# Patient Record
Sex: Female | Born: 1965 | Race: Black or African American | Hispanic: No | Smoking: Never smoker
Health system: Southern US, Community
[De-identification: ages and names within clinical notes are randomized; demographics above are authoritative.]

## PROBLEM LIST (undated history)

## (undated) DIAGNOSIS — E119 Type 2 diabetes mellitus without complications: Secondary | ICD-10-CM

## (undated) DIAGNOSIS — I82409 Acute embolism and thrombosis of unspecified deep veins of unspecified lower extremity: Secondary | ICD-10-CM

## (undated) DIAGNOSIS — I10 Essential (primary) hypertension: Secondary | ICD-10-CM

## (undated) DIAGNOSIS — F32A Depression, unspecified: Secondary | ICD-10-CM

## (undated) DIAGNOSIS — F419 Anxiety disorder, unspecified: Secondary | ICD-10-CM

## (undated) HISTORY — PX: TUBAL LIGATION: SHX77

## (undated) HISTORY — PX: WRIST SURGERY: SHX841

---

## 2014-03-06 ENCOUNTER — Emergency Department (HOSPITAL_BASED_OUTPATIENT_CLINIC_OR_DEPARTMENT_OTHER)
Admission: EM | Admit: 2014-03-06 | Discharge: 2014-03-06 | Disposition: A | Discharge status: Home / Self Care | Payer: 59 | Attending: Emergency Medicine | Admitting: Emergency Medicine

## 2014-03-06 ENCOUNTER — Encounter (HOSPITAL_BASED_OUTPATIENT_CLINIC_OR_DEPARTMENT_OTHER): Payer: Self-pay | Admitting: *Deleted

## 2014-03-06 ENCOUNTER — Emergency Department (HOSPITAL_BASED_OUTPATIENT_CLINIC_OR_DEPARTMENT_OTHER): Payer: 59

## 2014-03-06 DIAGNOSIS — I10 Essential (primary) hypertension: Secondary | ICD-10-CM | POA: Insufficient documentation

## 2014-03-06 DIAGNOSIS — S29012A Strain of muscle and tendon of back wall of thorax, initial encounter: Secondary | ICD-10-CM | POA: Insufficient documentation

## 2014-03-06 DIAGNOSIS — R0602 Shortness of breath: Secondary | ICD-10-CM | POA: Insufficient documentation

## 2014-03-06 DIAGNOSIS — R079 Chest pain, unspecified: Secondary | ICD-10-CM

## 2014-03-06 DIAGNOSIS — S3992XA Unspecified injury of lower back, initial encounter: Secondary | ICD-10-CM | POA: Diagnosis present

## 2014-03-06 DIAGNOSIS — S29001A Unspecified injury of muscle and tendon of front wall of thorax, initial encounter: Secondary | ICD-10-CM | POA: Insufficient documentation

## 2014-03-06 DIAGNOSIS — Y9389 Activity, other specified: Secondary | ICD-10-CM | POA: Insufficient documentation

## 2014-03-06 DIAGNOSIS — Y9289 Other specified places as the place of occurrence of the external cause: Secondary | ICD-10-CM | POA: Insufficient documentation

## 2014-03-06 DIAGNOSIS — T148XXA Other injury of unspecified body region, initial encounter: Secondary | ICD-10-CM

## 2014-03-06 DIAGNOSIS — X58XXXA Exposure to other specified factors, initial encounter: Secondary | ICD-10-CM | POA: Diagnosis not present

## 2014-03-06 DIAGNOSIS — Z720 Tobacco use: Secondary | ICD-10-CM | POA: Diagnosis not present

## 2014-03-06 DIAGNOSIS — Y998 Other external cause status: Secondary | ICD-10-CM | POA: Diagnosis not present

## 2014-03-06 DIAGNOSIS — R0789 Other chest pain: Secondary | ICD-10-CM

## 2014-03-06 HISTORY — DX: Acute embolism and thrombosis of unspecified deep veins of unspecified lower extremity: I82.409

## 2014-03-06 HISTORY — DX: Essential (primary) hypertension: I10

## 2014-03-06 LAB — CBC WITH DIFFERENTIAL/PLATELET
BASOS ABS: 0 10*3/uL (ref 0.0–0.1)
BASOS PCT: 0 % (ref 0–1)
EOS ABS: 0 10*3/uL (ref 0.0–0.7)
EOS PCT: 0 % (ref 0–5)
HCT: 39.3 % (ref 36.0–46.0)
Hemoglobin: 13.2 g/dL (ref 12.0–15.0)
Lymphocytes Relative: 13 % (ref 12–46)
Lymphs Abs: 1 10*3/uL (ref 0.7–4.0)
MCH: 30.1 pg (ref 26.0–34.0)
MCHC: 33.6 g/dL (ref 30.0–36.0)
MCV: 89.7 fL (ref 78.0–100.0)
Monocytes Absolute: 0.6 10*3/uL (ref 0.1–1.0)
Monocytes Relative: 9 % (ref 3–12)
Neutro Abs: 5.9 10*3/uL (ref 1.7–7.7)
Neutrophils Relative %: 78 % — ABNORMAL HIGH (ref 43–77)
PLATELETS: 225 10*3/uL (ref 150–400)
RBC: 4.38 MIL/uL (ref 3.87–5.11)
RDW: 13.1 % (ref 11.5–15.5)
WBC: 7.5 10*3/uL (ref 4.0–10.5)

## 2014-03-06 LAB — BASIC METABOLIC PANEL
ANION GAP: 11 (ref 5–15)
BUN: 15 mg/dL (ref 6–23)
CALCIUM: 9.3 mg/dL (ref 8.4–10.5)
CO2: 28 mEq/L (ref 19–32)
Chloride: 101 mEq/L (ref 96–112)
Creatinine, Ser: 0.7 mg/dL (ref 0.50–1.10)
Glucose, Bld: 91 mg/dL (ref 70–99)
POTASSIUM: 4 meq/L (ref 3.7–5.3)
SODIUM: 140 meq/L (ref 137–147)

## 2014-03-06 LAB — TROPONIN I

## 2014-03-06 LAB — D-DIMER, QUANTITATIVE (NOT AT ARMC)

## 2014-03-06 MED ORDER — KETOROLAC TROMETHAMINE 30 MG/ML IJ SOLN
30.0000 mg | Freq: Once | INTRAMUSCULAR | Status: AC
  Administered 2014-03-06: 30 mg via INTRAVENOUS
  Filled 2014-03-06: qty 1

## 2014-03-06 MED ORDER — TRAMADOL HCL 50 MG PO TABS: 50.0000 mg | ORAL_TABLET | Freq: Four times a day (QID) | ORAL | Status: DC | PRN

## 2014-03-06 MED ORDER — CYCLOBENZAPRINE HCL 10 MG PO TABS: 10.0000 mg | ORAL_TABLET | Freq: Three times a day (TID) | ORAL | Status: AC | PRN

## 2014-03-06 NOTE — ED Notes (Signed)
Back pain since yesterday with SOB- hx of blood clot- [place] recent travel- [place] evaluating in triage

## 2014-03-06 NOTE — Discharge Instructions (Signed)
Chest Wall Pain Chest wall pain is pain in or around the bones and muscles of your chest. It may take up to 6 weeks to get better. It may take longer if you must stay physically active in your work and activities.  CAUSES  Chest wall pain may happen on its own. However, it may be caused by:  A viral illness like the flu.  Injury.  Coughing.  Exercise.  Arthritis.  Fibromyalgia.  Shingles. HOME CARE INSTRUCTIONS   Avoid overtiring physical activity. Try not to strain or perform activities that cause pain. This includes any activities using your chest or your abdominal and side muscles, especially if heavy weights are used.  Put ice on the sore area.  Put ice in a plastic bag.  Place a towel between your skin and the bag.  Leave the ice on for 15-20 minutes per hour while awake for the first 2 days.  Only take over-the-counter or prescription medicines for pain, discomfort, or fever as directed by your caregiver. SEEK IMMEDIATE MEDICAL CARE IF:   Your pain increases, or you are very uncomfortable.  You have a fever.  Your chest pain becomes worse.  You have new, unexplained symptoms.  You have nausea or vomiting.  You feel sweaty or lightheaded.  You have a cough with phlegm (sputum), or you cough up blood. MAKE SURE YOU:   Understand these instructions.  Will watch your condition.  Will get help right away if you are not doing well or get worse. Document Released: 04/15/2005 Document Revised: 07/08/2011 Document Reviewed: 12/10/2010 [facility] Patient Information 2015 [place], [place]. This information is not intended to replace advice given to you by your health care provider. Make sure you discuss any questions you have with your health care provider.  Muscle Strain A muscle strain is an injury that occurs when a muscle is stretched beyond its normal length. Usually a small number of muscle fibers are torn when this happens. Muscle strain is rated in degrees.  First-degree strains have the least amount of muscle fiber tearing and pain. Second-degree and third-degree strains have increasingly more tearing and pain.  Usually, recovery from muscle strain takes 1-2 weeks. Complete healing takes 5-6 weeks.  CAUSES  Muscle strain happens when a sudden, violent force placed on a muscle stretches it too far. This may occur with lifting, sports, or a fall.  RISK FACTORS Muscle strain is especially common in athletes.  SIGNS AND SYMPTOMS At the site of the muscle strain, there may be:  Pain.  Bruising.  Swelling.  Difficulty using the muscle due to pain or lack of normal function. DIAGNOSIS  Your health care provider will perform a physical exam and ask about your medical history. TREATMENT  Often, the best treatment for a muscle strain is resting, icing, and applying cold compresses to the injured area.  HOME CARE INSTRUCTIONS   Use the PRICE method of treatment to promote muscle healing during the first 2-3 days after your injury. The PRICE method involves:  Protecting the muscle from being injured again.  Restricting your activity and resting the injured body part.  Icing your injury. To do this, put ice in a plastic bag. Place a towel between your skin and the bag. Then, apply the ice and leave it on from 15-20 minutes each hour. After the third day, switch to moist heat packs.  Apply compression to the injured area with a splint or elastic bandage. Be careful not to wrap it too tightly. This  may interfere with blood circulation or increase swelling.  Elevate the injured body part above the level of your heart as often as you can.  Only take over-the-counter or prescription medicines for pain, discomfort, or fever as directed by your health care provider.  Warming up prior to exercise helps to prevent future muscle strains. SEEK MEDICAL CARE IF:   You have increasing pain or swelling in the injured area.  You have numbness, tingling, or  a significant loss of strength in the injured area. MAKE SURE YOU:   Understand these instructions.  Will watch your condition.  Will get help right away if you are not doing well or get worse. Document Released: 04/15/2005 Document Revised: 02/03/2013 Document Reviewed: 11/12/2012 [facility] Patient Information 2015 [place], [place]. This information is not intended to replace advice given to you by your health care provider. Make sure you discuss any questions you have with your health care provider. Muscle Strain A muscle strain is an injury that occurs when a muscle is stretched beyond its normal length. Usually a small number of muscle fibers are torn when this happens. Muscle strain is rated in degrees. First-degree strains have the least amount of muscle fiber tearing and pain. Second-degree and third-degree strains have increasingly more tearing and pain.  Usually, recovery from muscle strain takes 1-2 weeks. Complete healing takes 5-6 weeks.  CAUSES  Muscle strain happens when a sudden, violent force placed on a muscle stretches it too far. This may occur with lifting, sports, or a fall.  RISK FACTORS Muscle strain is especially common in athletes.  SIGNS AND SYMPTOMS At the site of the muscle strain, there may be:  Pain.  Bruising.  Swelling.  Difficulty using the muscle due to pain or lack of normal function. DIAGNOSIS  Your health care provider will perform a physical exam and ask about your medical history. TREATMENT  Often, the best treatment for a muscle strain is resting, icing, and applying cold compresses to the injured area.  HOME CARE INSTRUCTIONS   Use the PRICE method of treatment to promote muscle healing during the first 2-3 days after your injury. The PRICE method involves:  Protecting the muscle from being injured again.  Restricting your activity and resting the injured body part.  Icing your injury. To do this, put ice in a plastic bag. Place a towel  between your skin and the bag. Then, apply the ice and leave it on from 15-20 minutes each hour. After the third day, switch to moist heat packs.  Apply compression to the injured area with a splint or elastic bandage. Be careful not to wrap it too tightly. This may interfere with blood circulation or increase swelling.  Elevate the injured body part above the level of your heart as often as you can.  Only take over-the-counter or prescription medicines for pain, discomfort, or fever as directed by your health care provider.  Warming up prior to exercise helps to prevent future muscle strains. SEEK MEDICAL CARE IF:   You have increasing pain or swelling in the injured area.  You have numbness, tingling, or a significant loss of strength in the injured area. MAKE SURE YOU:   Understand these instructions.  Will watch your condition.  Will get help right away if you are not doing well or get worse. Document Released: 04/15/2005 Document Revised: 02/03/2013 Document Reviewed: 11/12/2012 [facility] Patient Information 2015 [place], [place]. This information is not intended to replace advice given to you by your health care  provider. Make sure you discuss any questions you have with your health care provider. ° °

## 2014-03-06 NOTE — ED Provider Notes (Signed)
CSN: [id number]     Arrival date & time 03/06/14  1517 History  This chart was scribed for Gilda Creasehristopher J. Jamirah Zelaya, * by Roxy Cedarhandni Bhalodia, ED Scribe. This patient was seen in room MH06/MH06 and the patient's care was started at 3:47 PM.   Chief Complaint  Patient presents with  . Back Pain   Patient is a 48 y.o. female presenting with back pain. The history is provided by the patient. No language interpreter was used.  Back Pain   HPI Comments: Katrina Callererri Penner is a 48 y.o. female with a history of DVT and hypertension, who presents to the Emergency Department complaining of moderate back pain that began yesterday. Patient states that the pain worsens with movement. She states that she has a history of a blood clot in her back and groin areas in the past. She also reports associated SOB and cough. She denies recent travel.  Past Medical History  Diagnosis Date  . DVT (deep venous thrombosis)   . Hypertension    History reviewed. No pertinent past surgical history. No family history on file. History  Substance Use Topics  . Smoking status: Never Smoker   . Smokeless tobacco: Never Used  . Alcohol Use: Yes     Comment: occasional   OB History    No data available     Review of Systems  Respiratory: Positive for cough and shortness of breath.   Musculoskeletal: Positive for back pain.  All other systems reviewed and are negative.  Allergies  Review of patient's allergies indicates no known allergies.  Home Medications   Prior to Admission medications   Medication Sig Start Date End Date Taking? Authorizing Provider  HYDROcodone-acetaminophen (NORCO/VICODIN) 5-325 MG per tablet Take 1 tablet by mouth every 6 (six) hours as needed for moderate pain.   Yes Historical Provider, MD   Triage Vitals: LMP 02/13/2014 (Approximate) Physical Exam  Constitutional: She is oriented to person, place, and time. She appears well-developed and well-nourished. No distress.  HENT:  Head:  Normocephalic and atraumatic.  Right Ear: Hearing normal.  Left Ear: Hearing normal.  Nose: Nose normal.  Mouth/Throat: Oropharynx is clear and moist and mucous membranes are normal.  Eyes: Conjunctivae and EOM are normal. Pupils are equal, round, and reactive to light.  Neck: Normal range of motion. Neck supple.  Cardiovascular: Regular rhythm, S1 normal and S2 normal.  Exam reveals no gallop and no friction rub.   No murmur heard. Pulmonary/Chest: Effort normal and breath sounds normal. No respiratory distress. She exhibits no tenderness.  Abdominal: Soft. Normal appearance and bowel sounds are normal. There is no hepatosplenomegaly. There is no tenderness. There is no rebound, no guarding, no tenderness at McBurney's point and negative Murphy's sign. No hernia.  Musculoskeletal: Normal range of motion. She exhibits tenderness.  Tender in left posterior rib area. Severe pain with bending, twisting.  Neurological: She is alert and oriented to person, place, and time. She has normal strength. No cranial nerve deficit or sensory deficit. Coordination normal. GCS eye subscore is 4. GCS verbal subscore is 5. GCS motor subscore is 6.  Skin: Skin is warm, dry and intact. No rash noted. No cyanosis.  Psychiatric: She has a normal mood and affect. Her speech is normal and behavior is normal. Thought content normal.  Nursing note and vitals reviewed.  ED Course  Procedures (including critical care time)  DIAGNOSTIC STUDIES:  COORDINATION OF CARE: 4:11 PM- Discussed plans to order diagnostic lab work and EKG. Will give patient  fluids and monitor. Pt advised of plan for treatment and pt agrees.  Labs Review Labs Reviewed  CBC WITH DIFFERENTIAL - Abnormal; Notable for the following:    Neutrophils Relative % 78 (*)    All other components within normal limits  BASIC METABOLIC PANEL  TROPONIN I  D-DIMER, QUANTITATIVE    Imaging Review No results found.   EKG Interpretation   Date/Time:   Sunday March 06 2014 16:24:30 EST Ventricular Rate:  93 PR Interval:  132 QRS Duration: 80 QT Interval:  354 QTC Calculation: 440 R Axis:   -4 Text Interpretation:  Normal sinus rhythm Normal ECG Confirmed by Lavi Sheehan   MD, Marli Diego [phone]([id number]) on 03/06/2014 4:27:54 PM     MDM   Final diagnoses:  Chest pain  Chest wall pain  Muscle strain   Patient presents to the ER for evaluation of pain in the left upper back. Pain is in the posterior ribs on the left side. She reports that there is significant worsening with movement and the area is very tender. This seems consistent with musculoskeletal pain. 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 Because of this, PE was considered, although felt to be unlikely. Patient's d-dimer is negative and I do not feel she requires any further workup based on her current presentation and low pretest probability for PE. Cardiac workup was also negative. Patient reassured, will be treated for musculoskeletal pain.  I personally performed the services described in this documentation, which was scribed in my presence. The recorded information has been reviewed and is accurate.  Gilda Creasehristopher J. Peggye Poon, MD 03/06/14 [phone]

## 2021-10-18 ENCOUNTER — Emergency Department (HOSPITAL_BASED_OUTPATIENT_CLINIC_OR_DEPARTMENT_OTHER): Payer: 59

## 2021-10-18 ENCOUNTER — Emergency Department (HOSPITAL_BASED_OUTPATIENT_CLINIC_OR_DEPARTMENT_OTHER)
Admission: EM | Admit: 2021-10-18 | Discharge: 2021-10-18 | Disposition: A | Discharge status: Home / Self Care | Payer: 59 | Attending: Emergency Medicine | Admitting: Emergency Medicine

## 2021-10-18 ENCOUNTER — Encounter (HOSPITAL_BASED_OUTPATIENT_CLINIC_OR_DEPARTMENT_OTHER): Payer: Self-pay | Admitting: Pediatrics

## 2021-10-18 ENCOUNTER — Other Ambulatory Visit: Payer: Self-pay

## 2021-10-18 DIAGNOSIS — Z8616 Personal history of COVID-19: Secondary | ICD-10-CM | POA: Insufficient documentation

## 2021-10-18 DIAGNOSIS — M79605 Pain in left leg: Secondary | ICD-10-CM | POA: Diagnosis not present

## 2021-10-18 DIAGNOSIS — R0602 Shortness of breath: Secondary | ICD-10-CM | POA: Diagnosis present

## 2021-10-18 LAB — COMPREHENSIVE METABOLIC PANEL
ALT: 15 U/L (ref 0–44)
AST: 15 U/L (ref 15–41)
Albumin: 3.5 g/dL (ref 3.5–5.0)
Alkaline Phosphatase: 76 U/L (ref 38–126)
Anion gap: 6 (ref 5–15)
BUN: 19 mg/dL (ref 6–20)
CO2: 27 mmol/L (ref 22–32)
Calcium: 8.9 mg/dL (ref 8.9–10.3)
Chloride: 106 mmol/L (ref 98–111)
Creatinine, Ser: 0.8 mg/dL (ref 0.44–1.00)
GFR, Estimated: >60 mL/min (ref >60)
Glucose, Bld: 164 mg/dL — ABNORMAL HIGH (ref 70–99)
Potassium: 3.7 mmol/L (ref 3.5–5.1)
Sodium: 139 mmol/L (ref 135–145)
Total Bilirubin: 0.3 mg/dL (ref 0.3–1.2)
Total Protein: 7.3 g/dL (ref 6.5–8.1)

## 2021-10-18 LAB — CBC WITH DIFFERENTIAL/PLATELET
Abs Immature Granulocytes: 0.02 10*3/uL (ref 0.00–0.07)
Basophils Absolute: 0 10*3/uL (ref 0.0–0.1)
Basophils Relative: 0 %
Eosinophils Absolute: 0 10*3/uL (ref 0.0–0.5)
Eosinophils Relative: 1 %
HCT: 43.1 % (ref 36.0–46.0)
Hemoglobin: 14.4 g/dL (ref 12.0–15.0)
Immature Granulocytes: 0 %
Lymphocytes Relative: 23 %
Lymphs Abs: 1.1 10*3/uL (ref 0.7–4.0)
MCH: 30.1 pg (ref 26.0–34.0)
MCHC: 33.4 g/dL (ref 30.0–36.0)
MCV: 90.2 fL (ref 80.0–100.0)
Monocytes Absolute: 0.4 10*3/uL (ref 0.1–1.0)
Monocytes Relative: 8 %
Neutro Abs: 3 10*3/uL (ref 1.7–7.7)
Neutrophils Relative %: 68 %
Platelets: 215 10*3/uL (ref 150–400)
RBC: 4.78 MIL/uL (ref 3.87–5.11)
RDW: 12.7 % (ref 11.5–15.5)
WBC: 4.5 10*3/uL (ref 4.0–10.5)
nRBC: 0 % (ref 0.0–0.2)

## 2021-10-18 LAB — D-DIMER, QUANTITATIVE: D-Dimer, Quant: 0.7 ug/mL-FEU — ABNORMAL HIGH (ref 0.00–0.50)

## 2021-10-18 MED ORDER — IBUPROFEN 800 MG PO TABS
800.0000 mg | ORAL_TABLET | Freq: Once | ORAL | Status: AC
  Administered 2021-10-18: 800 mg via ORAL
  Filled 2021-10-18: qty 1

## 2021-10-18 MED ORDER — ACETAMINOPHEN 325 MG PO TABS
650.0000 mg | ORAL_TABLET | Freq: Once | ORAL | Status: AC
  Administered 2021-10-18: 650 mg via ORAL
  Filled 2021-10-18: qty 2

## 2021-10-18 MED ORDER — IOHEXOL 350 MG/ML SOLN
100.0000 mL | Freq: Once | INTRAVENOUS | Status: AC | PRN
Start: 2021-10-18 — End: 2021-10-18
  Administered 2021-10-18: 100 mL via INTRAVENOUS

## 2021-10-18 NOTE — ED Triage Notes (Signed)
C/O shortness of breathe and leg pain worst w/ exertion x 2 weeks; reports hx of HTN;

## 2021-10-18 NOTE — Discharge Instructions (Signed)
Please follow-up if your symptoms worsen or fail to improve, I recommend that you follow-up with your primary care doctor for further evaluation of your symptoms.  Your work-up today was reassuring, and showed no evidence of blood clot.

## 2022-08-12 ENCOUNTER — Other Ambulatory Visit (HOSPITAL_COMMUNITY): Payer: Self-pay | Admitting: Orthopedic Surgery

## 2022-08-15 ENCOUNTER — Other Ambulatory Visit: Payer: Self-pay

## 2022-08-15 ENCOUNTER — Encounter (HOSPITAL_BASED_OUTPATIENT_CLINIC_OR_DEPARTMENT_OTHER): Payer: Self-pay | Admitting: Orthopedic Surgery

## 2022-08-20 ENCOUNTER — Encounter (HOSPITAL_BASED_OUTPATIENT_CLINIC_OR_DEPARTMENT_OTHER)
Admission: RE | Admit: 2022-08-20 | Discharge: 2022-08-20 | Disposition: A | Discharge status: Home / Self Care | Payer: 59 | Source: Ambulatory Visit | Attending: Orthopedic Surgery | Admitting: Orthopedic Surgery

## 2022-08-20 DIAGNOSIS — E119 Type 2 diabetes mellitus without complications: Secondary | ICD-10-CM | POA: Diagnosis not present

## 2022-08-20 DIAGNOSIS — Z01812 Encounter for preprocedural laboratory examination: Secondary | ICD-10-CM | POA: Diagnosis present

## 2022-08-20 LAB — BASIC METABOLIC PANEL
Anion gap: 10 (ref 5–15)
BUN: 12 mg/dL (ref 6–20)
CO2: 25 mmol/L (ref 22–32)
Calcium: 9.3 mg/dL (ref 8.9–10.3)
Chloride: 102 mmol/L (ref 98–111)
Creatinine, Ser: 0.85 mg/dL (ref 0.44–1.00)
GFR, Estimated: >60 mL/min (ref >60)
Glucose, Bld: 120 mg/dL — ABNORMAL HIGH (ref 70–99)
Potassium: 4.1 mmol/L (ref 3.5–5.1)
Sodium: 137 mmol/L (ref 135–145)

## 2022-08-20 NOTE — Progress Notes (Signed)

## 2022-08-22 ENCOUNTER — Encounter (HOSPITAL_BASED_OUTPATIENT_CLINIC_OR_DEPARTMENT_OTHER)
Admission: RE | Disposition: A | Discharge status: Home / Self Care | Payer: Self-pay | Source: Home / Self Care | Attending: Orthopedic Surgery

## 2022-08-22 ENCOUNTER — Other Ambulatory Visit: Payer: Self-pay

## 2022-08-22 ENCOUNTER — Ambulatory Visit (HOSPITAL_BASED_OUTPATIENT_CLINIC_OR_DEPARTMENT_OTHER): Admitting: Certified Registered"

## 2022-08-22 ENCOUNTER — Encounter (HOSPITAL_BASED_OUTPATIENT_CLINIC_OR_DEPARTMENT_OTHER): Payer: Self-pay | Admitting: Orthopedic Surgery

## 2022-08-22 ENCOUNTER — Ambulatory Visit (HOSPITAL_BASED_OUTPATIENT_CLINIC_OR_DEPARTMENT_OTHER)
Admission: RE | Admit: 2022-08-22 | Discharge: 2022-08-22 | Disposition: A | Discharge status: Home / Self Care | Attending: Orthopedic Surgery | Admitting: Orthopedic Surgery

## 2022-08-22 ENCOUNTER — Ambulatory Visit (HOSPITAL_BASED_OUTPATIENT_CLINIC_OR_DEPARTMENT_OTHER)

## 2022-08-22 DIAGNOSIS — X58XXXA Exposure to other specified factors, initial encounter: Secondary | ICD-10-CM | POA: Insufficient documentation

## 2022-08-22 DIAGNOSIS — F418 Other specified anxiety disorders: Secondary | ICD-10-CM | POA: Insufficient documentation

## 2022-08-22 DIAGNOSIS — E119 Type 2 diabetes mellitus without complications: Secondary | ICD-10-CM | POA: Insufficient documentation

## 2022-08-22 DIAGNOSIS — Z6841 Body Mass Index (BMI) 40.0 and over, adult: Secondary | ICD-10-CM | POA: Insufficient documentation

## 2022-08-22 DIAGNOSIS — Y990 Civilian activity done for income or pay: Secondary | ICD-10-CM | POA: Diagnosis not present

## 2022-08-22 DIAGNOSIS — I10 Essential (primary) hypertension: Secondary | ICD-10-CM | POA: Insufficient documentation

## 2022-08-22 DIAGNOSIS — S82842A Displaced bimalleolar fracture of left lower leg, initial encounter for closed fracture: Secondary | ICD-10-CM

## 2022-08-22 DIAGNOSIS — Z01818 Encounter for other preprocedural examination: Secondary | ICD-10-CM

## 2022-08-22 HISTORY — DX: Depression, unspecified: F32.A

## 2022-08-22 HISTORY — DX: Type 2 diabetes mellitus without complications: E11.9

## 2022-08-22 HISTORY — DX: Anxiety disorder, unspecified: F41.9

## 2022-08-22 HISTORY — PX: ORIF ANKLE FRACTURE: SHX5408

## 2022-08-22 LAB — GLUCOSE, CAPILLARY
Glucose-Capillary: 81 mg/dL (ref 70–99)
Glucose-Capillary: 78 mg/dL (ref 70–99)

## 2022-08-22 SURGERY — OPEN REDUCTION INTERNAL FIXATION (ORIF) ANKLE FRACTURE
Anesthesia: Regional | Site: Ankle | Laterality: Left

## 2022-08-22 MED ORDER — CEFAZOLIN SODIUM-DEXTROSE 2-4 GM/100ML-% IV SOLN
INTRAVENOUS | Status: AC
  Filled 2022-08-22: qty 100

## 2022-08-22 MED ORDER — MIDAZOLAM HCL 2 MG/2ML IJ SOLN
2.0000 mg | Freq: Once | INTRAMUSCULAR | Status: AC
  Administered 2022-08-22: 2 mg via INTRAVENOUS

## 2022-08-22 MED ORDER — FENTANYL CITRATE (PF) 100 MCG/2ML IJ SOLN
100.0000 ug | Freq: Once | INTRAMUSCULAR | Status: AC
  Administered 2022-08-22: 100 ug via INTRAVENOUS

## 2022-08-22 MED ORDER — PROPOFOL 10 MG/ML IV BOLUS
INTRAVENOUS | Status: DC | PRN
  Administered 2022-08-22: 200 mg via INTRAVENOUS

## 2022-08-22 MED ORDER — MIDAZOLAM HCL 2 MG/2ML IJ SOLN
INTRAMUSCULAR | Status: AC
  Filled 2022-08-22: qty 2

## 2022-08-22 MED ORDER — OXYCODONE HCL 5 MG/5ML PO SOLN: 5.0000 mg | Freq: Once | ORAL | Status: DC | PRN

## 2022-08-22 MED ORDER — LIDOCAINE HCL (CARDIAC) PF 100 MG/5ML IV SOSY
PREFILLED_SYRINGE | INTRAVENOUS | Status: DC | PRN
  Administered 2022-08-22: 30 mg via INTRAVENOUS

## 2022-08-22 MED ORDER — PROPOFOL 500 MG/50ML IV EMUL
INTRAVENOUS | Status: DC | PRN
  Administered 2022-08-22: 25 ug/kg/min via INTRAVENOUS

## 2022-08-22 MED ORDER — CEFAZOLIN SODIUM-DEXTROSE 2-4 GM/100ML-% IV SOLN
2.0000 g | INTRAVENOUS | Status: AC
  Administered 2022-08-22: 2 g via INTRAVENOUS

## 2022-08-22 MED ORDER — PROMETHAZINE HCL 25 MG/ML IJ SOLN: 6.2500 mg | INTRAMUSCULAR | Status: DC | PRN

## 2022-08-22 MED ORDER — VANCOMYCIN HCL 500 MG IV SOLR
INTRAVENOUS | Status: DC | PRN
  Administered 2022-08-22: 500 mg via TOPICAL

## 2022-08-22 MED ORDER — SODIUM CHLORIDE 0.9 % IV SOLN: INTRAVENOUS | Status: DC

## 2022-08-22 MED ORDER — ROPIVACAINE HCL 5 MG/ML IJ SOLN
INTRAMUSCULAR | Status: DC | PRN
  Administered 2022-08-22: 50 mL via PERINEURAL

## 2022-08-22 MED ORDER — FENTANYL CITRATE (PF) 100 MCG/2ML IJ SOLN
INTRAMUSCULAR | Status: AC
  Filled 2022-08-22: qty 2

## 2022-08-22 MED ORDER — ONDANSETRON HCL 4 MG/2ML IJ SOLN
INTRAMUSCULAR | Status: DC | PRN
  Administered 2022-08-22: 4 mg via INTRAVENOUS

## 2022-08-22 MED ORDER — OXYCODONE HCL 5 MG PO TABS: 5.0000 mg | ORAL_TABLET | Freq: Once | ORAL | Status: DC | PRN

## 2022-08-22 MED ORDER — 0.9 % SODIUM CHLORIDE (POUR BTL) OPTIME
TOPICAL | Status: DC | PRN
  Administered 2022-08-22: 200 mL

## 2022-08-22 MED ORDER — AMISULPRIDE (ANTIEMETIC) 5 MG/2ML IV SOLN: 10.0000 mg | Freq: Once | INTRAVENOUS | Status: DC | PRN

## 2022-08-22 MED ORDER — RIVAROXABAN 10 MG PO TABS: 10.0000 mg | ORAL_TABLET | Freq: Every day | ORAL | 0 refills | Status: AC

## 2022-08-22 MED ORDER — DEXAMETHASONE SODIUM PHOSPHATE 10 MG/ML IJ SOLN
INTRAMUSCULAR | Status: DC | PRN
  Administered 2022-08-22: 4 mg via INTRAVENOUS

## 2022-08-22 MED ORDER — HYDROMORPHONE HCL 1 MG/ML IJ SOLN: 0.2500 mg | INTRAMUSCULAR | Status: DC | PRN

## 2022-08-22 MED ORDER — OXYCODONE HCL 5 MG PO TABS
5.0000 mg | ORAL_TABLET | Freq: Four times a day (QID) | ORAL | 0 refills | Status: AC | PRN
Start: 2022-08-22 — End: 2022-08-25

## 2022-08-22 MED ORDER — MEPERIDINE HCL 25 MG/ML IJ SOLN: 6.2500 mg | INTRAMUSCULAR | Status: DC | PRN

## 2022-08-22 MED ORDER — EPHEDRINE SULFATE (PRESSORS) 50 MG/ML IJ SOLN
INTRAMUSCULAR | Status: DC | PRN
  Administered 2022-08-22: 10 mg via INTRAVENOUS

## 2022-08-22 MED ORDER — LACTATED RINGERS IV SOLN: INTRAVENOUS | Status: DC

## 2022-08-22 SURGICAL SUPPLY — 73 items
APL PRP STRL LF DISP 70% ISPRP (MISCELLANEOUS) ×1
BANDAGE ESMARK 6X9 LF (GAUZE/BANDAGES/DRESSINGS) IMPLANT
BIT DRILL 2.5X2.75 QC CALB (BIT) IMPLANT
BIT DRILL 3.5X5.5 QC CALB (BIT) IMPLANT
BLADE SURG 15 STRL LF DISP TIS (BLADE) ×2 IMPLANT
BLADE SURG 15 STRL SS (BLADE) ×2
BNDG CMPR 5X4 KNIT ELC UNQ LF (GAUZE/BANDAGES/DRESSINGS) ×1
BNDG CMPR 5X62 HK CLSR LF (GAUZE/BANDAGES/DRESSINGS) ×1
BNDG CMPR 6"X 5 YARDS HK CLSR (GAUZE/BANDAGES/DRESSINGS) ×1
BNDG CMPR 9X4 STRL LF SNTH (GAUZE/BANDAGES/DRESSINGS)
BNDG CMPR 9X6 STRL LF SNTH (GAUZE/BANDAGES/DRESSINGS)
BNDG ELASTIC 4INX 5YD STR LF (GAUZE/BANDAGES/DRESSINGS) ×1 IMPLANT
BNDG ELASTIC 6INX 5YD STR LF (GAUZE/BANDAGES/DRESSINGS) ×1 IMPLANT
BNDG ESMARK 4X9 LF (GAUZE/BANDAGES/DRESSINGS) IMPLANT
BNDG ESMARK 6X9 LF (GAUZE/BANDAGES/DRESSINGS)
CANISTER SUCT 1200ML W/VALVE (MISCELLANEOUS) ×1 IMPLANT
CHLORAPREP W/TINT 26 (MISCELLANEOUS) ×1 IMPLANT
COVER BACK TABLE 60X90IN (DRAPES) ×1 IMPLANT
CUFF TOURN SGL QUICK 34 (TOURNIQUET CUFF)
CUFF TRNQT CYL 34X4.125X (TOURNIQUET CUFF) IMPLANT
DRAPE EXTREMITY T 121X128X90 (DISPOSABLE) ×1 IMPLANT
DRAPE OEC MINIVIEW 54X84 (DRAPES) ×1 IMPLANT
DRAPE U-SHAPE 47X51 STRL (DRAPES) ×1 IMPLANT
DRSG MEPITEL 4X7.2 (GAUZE/BANDAGES/DRESSINGS) ×1 IMPLANT
ELECT REM PT RETURN 9FT ADLT (ELECTROSURGICAL) ×1
ELECTRODE REM PT RTRN 9FT ADLT (ELECTROSURGICAL) ×1 IMPLANT
GAUZE PAD ABD 8X10 STRL (GAUZE/BANDAGES/DRESSINGS) ×2 IMPLANT
GAUZE SPONGE 4X4 12PLY STRL (GAUZE/BANDAGES/DRESSINGS) ×1 IMPLANT
GLOVE BIO SURGEON STRL SZ8 (GLOVE) ×1 IMPLANT
GLOVE BIOGEL PI IND STRL 7.0 (GLOVE) IMPLANT
GLOVE BIOGEL PI IND STRL 8 (GLOVE) ×2 IMPLANT
GLOVE SURG SS PI 7.0 STRL IVOR (GLOVE) IMPLANT
GOWN STRL REUS W/ TWL LRG LVL3 (GOWN DISPOSABLE) ×1 IMPLANT
GOWN STRL REUS W/ TWL XL LVL3 (GOWN DISPOSABLE) ×2 IMPLANT
GOWN STRL REUS W/TWL LRG LVL3 (GOWN DISPOSABLE) ×1
GOWN STRL REUS W/TWL XL LVL3 (GOWN DISPOSABLE) ×1
MAT PREVALON FULL STRYKER (MISCELLANEOUS) IMPLANT
NDL HYPO 22X1.5 SAFETY MO (MISCELLANEOUS) IMPLANT
NEEDLE HYPO 22X1.5 SAFETY MO (MISCELLANEOUS) IMPLANT
NS IRRIG 1000ML POUR BTL (IV SOLUTION) ×1 IMPLANT
PACK BASIN DAY SURGERY FS (CUSTOM PROCEDURE TRAY) ×1 IMPLANT
PAD CAST 4YDX4 CTTN HI CHSV (CAST SUPPLIES) ×1 IMPLANT
PADDING CAST ABS COTTON 4X4 ST (CAST SUPPLIES) IMPLANT
PADDING CAST COTTON 4X4 STRL (CAST SUPPLIES) ×1
PADDING CAST COTTON 6X4 STRL (CAST SUPPLIES) ×1 IMPLANT
PENCIL SMOKE EVACUATOR (MISCELLANEOUS) ×1 IMPLANT
PLATE ACE 100DEG 7HOLE (Plate) IMPLANT
SANITIZER HAND PURELL FF 515ML (MISCELLANEOUS) ×1 IMPLANT
SCREW CORTICAL 3.5MM  16MM (Screw) ×3 IMPLANT
SCREW CORTICAL 3.5MM 14MM (Screw) IMPLANT
SCREW CORTICAL 3.5MM 16MM (Screw) IMPLANT
SCREW CORTICAL 3.5MM 18MM (Screw) IMPLANT
SHEET MEDIUM DRAPE 40X70 STRL (DRAPES) ×1 IMPLANT
SLEEVE SCD COMPRESS KNEE MED (STOCKING) ×1 IMPLANT
SPIKE FLUID TRANSFER (MISCELLANEOUS) IMPLANT
SPLINT PLASTER CAST FAST 5X30 (CAST SUPPLIES) ×20 IMPLANT
SPONGE T-LAP 18X18 ~~LOC~~+RFID (SPONGE) ×1 IMPLANT
STOCKINETTE 6  STRL (DRAPES) ×1
STOCKINETTE 6 STRL (DRAPES) ×1 IMPLANT
SUCTION FRAZIER HANDLE 10FR (MISCELLANEOUS) ×1
SUCTION TUBE FRAZIER 10FR DISP (MISCELLANEOUS) ×1 IMPLANT
SUT ETHILON 3 0 PS 1 (SUTURE) ×1 IMPLANT
SUT FIBERWIRE #2 38 T-5 BLUE (SUTURE)
SUT MNCRL AB 3-0 PS2 18 (SUTURE) IMPLANT
SUT VIC AB 2-0 SH 27 (SUTURE) ×1
SUT VIC AB 2-0 SH 27XBRD (SUTURE) ×1 IMPLANT
SUT VICRYL 0 SH 27 (SUTURE) IMPLANT
SUTURE FIBERWR #2 38 T-5 BLUE (SUTURE) IMPLANT
SYR BULB EAR ULCER 3OZ GRN STR (SYRINGE) ×1 IMPLANT
SYR CONTROL 10ML LL (SYRINGE) IMPLANT
TOWEL GREEN STERILE FF (TOWEL DISPOSABLE) ×2 IMPLANT
TUBE CONNECTING 20X1/4 (TUBING) ×1 IMPLANT
UNDERPAD 30X36 HEAVY ABSORB (UNDERPADS AND DIAPERS) ×1 IMPLANT

## 2022-08-22 NOTE — Anesthesia Procedure Notes (Signed)
Anesthesia Regional Block: Popliteal block   Pre-Anesthetic Checklist: , timeout performed,  Correct Patient, Correct Site, Correct Laterality,  Correct Procedure, Correct Position, site marked,  Risks and benefits discussed,  Surgical consent,  Pre-op evaluation,  At surgeon's request and post-op pain management  Laterality: Left  Prep: chloraprep       Needles:  Injection technique: Single-shot  Needle Type: Stimiplex     Needle Length: 9cm  Needle Gauge: 21     Additional Needles:   Procedures:,,,, ultrasound used (permanent image in chart),,    Narrative:  Start time: 08/22/2022 11:53 AM End time: 08/22/2022 11:58 AM Injection made incrementally with aspirations every 5 mL.  Performed by: Personally  Anesthesiologist: Lowella Curb, MD

## 2022-08-22 NOTE — Discharge Instructions (Addendum)
Toni Arthurs, MD [facility]  Please read the following information regarding your care after surgery.  Medications  You only need a prescription for the narcotic pain medicine (ex. oxycodone, Percocet, Norco).  All of the other medicines listed below are available over the counter. X Aleve 2 pills twice a day for the first 3 days after surgery. X acetominophen (Tylenol) 650 mg every 4-6 hours as you need for minor to moderate pain X oxycodone as prescribed for severe pain  Narcotic pain medicine (ex. oxycodone, Percocet, Vicodin) will cause constipation.  To prevent this problem, take the following medicines while you are taking any pain medicine. X docusate sodium (Colace) 100 mg twice a day X senna (Senokot) 2 tablets twice a day  X To help prevent blood clots, take Xarelto daily for two weeks after surgery.  You should also get up every hour while you are awake to move around.    Weight Bearing ? Bear weight when you are able on your operated leg or foot. ? Bear weight only on your operated foot in the post-op shoe. ? Do not bear any weight on the operated leg or foot.  Cast / Splint / Dressing ? Keep your splint, cast or dressing clean and dry.  Don't put anything (coat hanger, pencil, etc) down inside of it.  If it gets damp, use a hair dryer on the cool setting to dry it.  If it gets soaked, call the office to schedule an appointment for a cast change. ? Remove your dressing 3 days after surgery and cover the incisions with dry dressings.    After your dressing, cast or splint is removed; you may shower, but do not soak or scrub the wound.  Allow the water to run over it, and then gently pat it dry.  Swelling It is normal for you to have swelling where you had surgery.  To reduce swelling and pain, keep your toes above your nose for at least 3 days after surgery.  It may be necessary to keep your foot or leg elevated for several weeks.  If it hurts, it should be elevated.  Follow  Up Call my office at [phone] when you are discharged from the hospital or surgery center to schedule an appointment to be seen two weeks after surgery.  Call my office at [phone] if you develop a fever >101.5 F, nausea, vomiting, bleeding from the surgical site or severe pain.       Post Anesthesia Home Care Instructions  Activity: Get plenty of rest for the remainder of the day. A responsible individual must stay with you for 24 hours following the procedure.  For the next 24 hours, DO NOT: -Drive a car -Advertising copywriter -Drink alcoholic beverages -Take any medication unless instructed by your physician -Make any legal decisions or sign important papers.  Meals: Start with liquid foods such as gelatin or soup. Progress to regular foods as tolerated. Avoid greasy, spicy, heavy foods. If nausea and/or vomiting occur, drink only clear liquids until the nausea and/or vomiting subsides. Call your physician if vomiting continues.  Special Instructions/Symptoms: Your throat may feel dry or sore from the anesthesia or the breathing tube placed in your throat during surgery. If this causes discomfort, gargle with warm salt water. The discomfort should disappear within 24 hours.  If you had a scopolamine patch placed behind your ear for the management of post- operative nausea and/or vomiting:  1. The medication in the patch is effective for 72 hours,  after which it should be removed.  Wrap patch in a tissue and discard in the trash. Wash hands thoroughly with soap and water. 2. You may remove the patch earlier than 72 hours if you experience unpleasant side effects which may include dry mouth, dizziness or visual disturbances. 3. Avoid touching the patch. Wash your hands with soap and water after contact with the patch.   Regional Anesthesia Blocks  1. Numbness or the inability to move the "blocked" extremity may last from 3-48 hours after placement. The length of time depends on  the medication injected and your individual response to the medication. If the numbness is not going away after 48 hours, call your surgeon.  2. The extremity that is blocked will need to be protected until the numbness is gone and the  Strength has returned. Because you cannot feel it, you will need to take extra care to avoid injury. Because it may be weak, you may have difficulty moving it or using it. You may not know what position it is in without looking at it while the block is in effect.  3. For blocks in the legs and feet, returning to weight bearing and walking needs to be done carefully. You will need to wait until the numbness is entirely gone and the strength has returned. You should be able to move your leg and foot normally before you try and bear weight or walk. You will need someone to be with you when you first try to ensure you do not fall and possibly risk injury.  4. Bruising and tenderness at the needle site are common side effects and will resolve in a few days.  5. Persistent numbness or new problems with movement should be communicated to the surgeon or the [facility] [phone] [facility] [phone]).

## 2022-08-22 NOTE — Op Note (Signed)
08/22/2022  1:13 PM  PATIENT:  Katrina Tapia  57 y.o. female  PRE-OPERATIVE DIAGNOSIS: Left ankle lateral malleolus fracture  POST-OPERATIVE DIAGNOSIS: 1.  Left ankle bimalleolar fracture (lateral and posterior malleoli)  Procedure(s): 1.  Open treatment of left ankle bimalleolar fracture with internal fixation 2.  Stress examination of the left ankle under fluoroscopy 3.  Left ankle AP, mortise and lateral radiographs  SURGEON:  Toni Arthurs, MD  ASSISTANT: None  ANESTHESIA:   General, regional  EBL:  minimal   TOURNIQUET:   Total Tourniquet Time Documented: Thigh (Left) - 29 minutes Total: Thigh (Left) - 29 minutes  COMPLICATIONS:  None apparent  DISPOSITION:  Extubated, awake and stable to recovery.  INDICATION FOR PROCEDURE: 57 year old female with a past medical history significant for diabetes injured her left ankle at work about 2 weeks ago.  She has a displaced lateral malleolus fracture and presents for surgical treatment of this unstable left ankle injury.  The risks and benefits of the alternative treatment options have been discussed in detail.  The patient wishes to proceed with surgery and specifically understands risks of bleeding, infection, nerve damage, blood clots, need for additional surgery, amputation and death.   PROCEDURE IN DETAIL:  After pre operative consent was obtained, and the correct operative site was identified, the patient was brought to the operating room and placed supine on the OR table.  Anesthesia was administered.  Pre-operative antibiotics were administered.  A surgical timeout was taken.  The left lower extremity was prepped and draped in standard sterile fashion with a tourniquet around the thigh.  The extremity was elevated, and the tourniquet was inflated to 250 mmHg.  Longitudinal incision was made over the lateral malleolus.  Dissection was carried sharply down through the subcutaneous tissues.  The fracture site was identified.  It was  cleaned of all hematoma and fibrous tissue.  It was irrigated copiously and then reduced.  Reduction was held with a lobster claw and a pointed tenaculum.  AP and lateral radiographs confirmed appropriate reduction of the fracture.  A 3.5 mm lag screw was then placed from posterior to anterior across the fracture site.  It was noted to have excellent purchase and compressed the fracture site appropriately.  A 7 hole one third tubular plate from the [employer] small frag set was then contoured to fit the lateral malleolus.  It was secured proximally with 3 bicortical screws and distally with 3 unicortical screws.  AP, mortise and lateral radiographs confirmed appropriate reduction of the fracture and appropriate position and length of all hardware.  A small posterior malleolus fracture was identified on these radiographs.  It was nondisplaced and did not include a significant portion of the articular surface.  Stress examination was then performed.  Dorsiflexion and external rotation stress was applied to the supinated forefoot with the ankle viewed in the mortise projection.  There was no decrease in the tib-fib overlap or widening of the medial clear space.  The wound was then irrigated copiously and sprinkled with vancomycin powder.  Subcutaneous tissues were approximated with 2-0 Vicryl.  Skin incision was closed with running 3-0 nylon.  Sterile dressings were applied followed by a well-padded short leg splint.  The tourniquet was released after application of the dressings.  The patient was awakened from anesthesia and transported to the recovery room in stable condition.  FOLLOW UP PLAN: Nonweightbearing on the left lower extremity.  Follow-up in the office in 2 weeks for suture removal and conversion to  a cam boot for early range of motion and weightbearing.  Xarelto for DVT prophylaxis.   RADIOGRAPHS: AP, mortise and lateral radiographs of the left ankle are obtained intraoperatively.  These show  interval reduction and fixation of the lateral malleolus fracture.  Posterior malleolus fracture is appropriately aligned.  No other acute injuries are noted.

## 2022-08-22 NOTE — Anesthesia Postprocedure Evaluation (Signed)
Anesthesia Post Note  Patient: Katrina Tapia  Procedure(s) Performed: Open Reduction Internal Fixation (ORIF) Left ankle lateral malleolus fracture (Left: Ankle)     Patient location during evaluation: PACU Anesthesia Type: Regional and General Level of consciousness: awake and alert Pain management: pain level controlled Vital Signs Assessment: post-procedure vital signs reviewed and stable Respiratory status: spontaneous breathing, nonlabored ventilation and respiratory function stable Cardiovascular status: blood pressure returned to baseline and stable Postop Assessment: no apparent nausea or vomiting Anesthetic complications: no   No notable events documented.  Last Vitals:  Vitals:   08/22/22 1329 08/22/22 1407  BP: (!) 145/87 (!) 182/89  Pulse: 91 83  Resp: 10 18  Temp:  (!) 36.4 C  SpO2: 99% 96%    Last Pain:  Vitals:   08/22/22 1407  TempSrc: Temporal  PainSc: 0-No pain                 Lowella Curb

## 2022-08-22 NOTE — Anesthesia Procedure Notes (Signed)
Anesthesia Regional Block: Adductor canal block   Pre-Anesthetic Checklist: , timeout performed,  Correct Patient, Correct Site, Correct Laterality,  Correct Procedure, Correct Position, site marked,  Risks and benefits discussed,  Surgical consent,  Pre-op evaluation,  At surgeon's request and post-op pain management  Laterality: Left  Prep: chloraprep       Needles:  Injection technique: Single-shot  Needle Type: Stimiplex     Needle Length: 9cm  Needle Gauge: 21     Additional Needles:   Procedures:,,,, ultrasound used (permanent image in chart),,    Narrative:  Start time: 08/22/2022 11:52 AM End time: 08/22/2022 11:57 AM Injection made incrementally with aspirations every 5 mL.  Performed by: Personally  Anesthesiologist: Lowella Curb, MD

## 2022-08-22 NOTE — Transfer of Care (Signed)
Immediate Anesthesia Transfer of Care Note  Patient: Katrina Tapia  Procedure(s) Performed: Open Reduction Internal Fixation (ORIF) Left ankle lateral malleolus fracture (Left: Ankle)  Patient Location: PACU  Anesthesia Type:GA combined with regional for post-op pain  Level of Consciousness: drowsy and patient cooperative  Airway & Oxygen Therapy: Patient Spontanous Breathing and Patient connected to face mask oxygen  Post-op Assessment: Report given to RN and Post -op Vital signs reviewed and stable  Post vital signs: Reviewed and stable  Last Vitals:  Vitals Value Taken Time  BP 150/100 08/22/22 1304  Temp    Pulse 96 08/22/22 1305  Resp    SpO2 100 % 08/22/22 1305  Vitals shown include unvalidated device data.  Last Pain:  Vitals:   08/22/22 1030  TempSrc: Temporal  PainSc: 0-No pain         Complications: No notable events documented.

## 2022-08-22 NOTE — H&P (Signed)
Katrina Tapia is an 57 y.o. female.   Chief Complaint: left ankle injury HPI: 57 y/o female with PMH of diabetes c/o L ankle pain since a work injury two weeks ago.  She presents today for ORIF of her left lateral malleolus.  Past Medical History:  Diagnosis Date   Anxiety    Depression    Diabetes mellitus without complication    DVT (deep venous thrombosis)    Hypertension     Past Surgical History:  Procedure Laterality Date   TUBAL LIGATION     WRIST SURGERY Right     History reviewed. No pertinent family history. Social History:  reports that she has never smoked. She has never used smokeless tobacco. She reports current alcohol use. She reports that she does not use drugs.  Allergies: No Known Allergies  Medications Prior to Admission  Medication Sig Dispense Refill   atorvastatin (LIPITOR) 40 MG tablet Take 40 mg by mouth daily.     buPROPion (WELLBUTRIN XL) 300 MG 24 hr tablet Take 300 mg by mouth daily.     escitalopram (LEXAPRO) 20 MG tablet Take 20 mg by mouth daily.     hydrochlorothiazide (HYDRODIURIL) 25 MG tablet Take 25 mg by mouth daily.     SEMAGLUTIDE,0.25 OR 0.5MG /DOS, [place] Inject 0.5 mg into the skin once a week.     telmisartan (MICARDIS) 80 MG tablet Take 80 mg by mouth daily.     cyclobenzaprine (FLEXERIL) 10 MG tablet Take 1 tablet (10 mg total) by mouth 3 (three) times daily as needed for muscle spasms. 20 tablet 0   HYDROcodone-acetaminophen (NORCO/VICODIN) 5-325 MG per tablet Take 1 tablet by mouth every 6 (six) hours as needed for moderate pain.     traMADol (ULTRAM) 50 MG tablet Take 1 tablet (50 mg total) by mouth every 6 (six) hours as needed. 15 tablet 0    Results for orders placed or performed during the hospital encounter of 08/22/22 (from the past 48 hour(s))  Basic metabolic panel per protocol     Status: Abnormal   Collection Time: 08/20/22 11:00 AM  Result Value Ref Range   Sodium 137 135 - 145 mmol/L   Potassium 4.1 3.5 - 5.1 mmol/L    Chloride 102 98 - 111 mmol/L   CO2 25 22 - 32 mmol/L   Glucose, Bld 120 (H) 70 - 99 mg/dL    Comment: Glucose reference range applies only to samples taken after fasting for at least 8 hours.   BUN 12 6 - 20 mg/dL   Creatinine, Ser 1.61 0.44 - 1.00 mg/dL   Calcium 9.3 8.9 - 09.6 mg/dL   GFR, Estimated >04 >54 mL/min    Comment: (NOTE) Calculated using the CKD-EPI Creatinine Equation (2021)    Anion gap 10 5 - 15    Comment: Performed at [facility], [address] [place], [place] [address]   No results found.  Review of Systems  no recent f/c/n/v/wt loss  Height 5' 4.5" (1.638 m), weight 113.4 kg, last menstrual period 05/30/2017. Physical Exam  Wn wd woman in nad.  A and O.  EOMI.  Resp unlabored.  L ankle with swelling.  TTP laterally.  Normal sens to LT at the foot.  Assessment/Plan Left ankle lateral malleolus fracture - to the OR for ORIF of the lateral malleolus and possible syndesmosis.  The risks and benefits of the alternative treatment options have been discussed in detail.  The patient wishes to proceed with surgery  and specifically understands risks of bleeding, infection, nerve damage, blood clots, need for additional surgery, amputation and death.   Toni Arthurs, MD 08/28/2022, 10:30 AM

## 2022-08-22 NOTE — Anesthesia Procedure Notes (Signed)
Procedure Name: LMA Insertion Date/Time: 08/22/2022 12:16 PM  Performed by: Aurie Harroun, Jewel Baize, CRNAPre-anesthesia Checklist: Patient identified, Emergency Drugs available, Suction available and Patient being monitored Patient Re-evaluated:Patient Re-evaluated prior to induction Oxygen Delivery Method: Circle system utilized Preoxygenation: Pre-oxygenation with 100% oxygen Induction Type: IV induction Ventilation: Mask ventilation without difficulty LMA: LMA inserted LMA Size: 4.0 Number of attempts: 1 Airway Equipment and Method: Bite block Placement Confirmation: positive ETCO2 Tube secured with: Tape Dental Injury: Teeth and Oropharynx as per pre-operative assessment

## 2022-08-22 NOTE — Anesthesia Preprocedure Evaluation (Signed)
Anesthesia Evaluation  Patient identified by MRN, date of birth, ID band Patient awake    Reviewed: Allergy & Precautions, H&P , NPO status , Patient's Chart, lab work & pertinent test results  Airway Mallampati: II  TM Distance: >3 FB Neck ROM: Full    Dental no notable dental hx.    Pulmonary neg pulmonary ROS   Pulmonary exam normal breath sounds clear to auscultation       Cardiovascular hypertension, Pt. on medications negative cardio ROS Normal cardiovascular exam Rhythm:Regular Rate:Normal     Neuro/Psych   Anxiety Depression    negative neurological ROS  negative psych ROS   GI/Hepatic negative GI ROS, Neg liver ROS,,,  Endo/Other  diabetes  Morbid obesity  Renal/GU negative Renal ROS  negative genitourinary   Musculoskeletal negative musculoskeletal ROS (+)    Abdominal  (+) + obese  Peds negative pediatric ROS (+)  Hematology negative hematology ROS (+)   Anesthesia Other Findings   Reproductive/Obstetrics negative OB ROS                             Anesthesia Physical Anesthesia Plan  ASA: 3  Anesthesia Plan: General and Regional   Post-op Pain Management: Regional block* and Minimal or no pain anticipated   Induction: Intravenous  PONV Risk Score and Plan: 3 and Ondansetron, Dexamethasone, Midazolam and Treatment may vary due to age or medical condition  Airway Management Planned: LMA  Additional Equipment:   Intra-op Plan:   Post-operative Plan: Extubation in OR  Informed Consent: I have reviewed the patients History and Physical, chart, labs and discussed the procedure including the risks, benefits and alternatives for the proposed anesthesia with the patient or authorized representative who has indicated his/her understanding and acceptance.     Dental advisory given  Plan Discussed with: CRNA  Anesthesia Plan Comments:        Anesthesia Quick  Evaluation

## 2022-08-23 ENCOUNTER — Encounter (HOSPITAL_BASED_OUTPATIENT_CLINIC_OR_DEPARTMENT_OTHER): Payer: Self-pay | Admitting: Orthopedic Surgery

## 2023-01-27 IMAGING — CT CT ANGIO CHEST
2 of 9 series · 18 of 36 positions shown · IV contrast (Omnipaque)
Comparison: None Available.

CLINICAL DATA: Shortness of breath, positive D-dimer concern for
pulmonary embolus.

EXAM:
CT ANGIOGRAPHY CHEST WITH CONTRAST
TECHNIQUE: Multidetector CT imaging of the chest was performed using the
standard protocol during bolus administration of intravenous
contrast. Multiplanar CT image reconstructions and MIPs were
obtained to evaluate the vascular anatomy.

[Series 6: pe thins · axial · 0.75mm/px · z∈[+819,+1098]mm · 17 of 420 slices shown]
[im 24/420  lung]
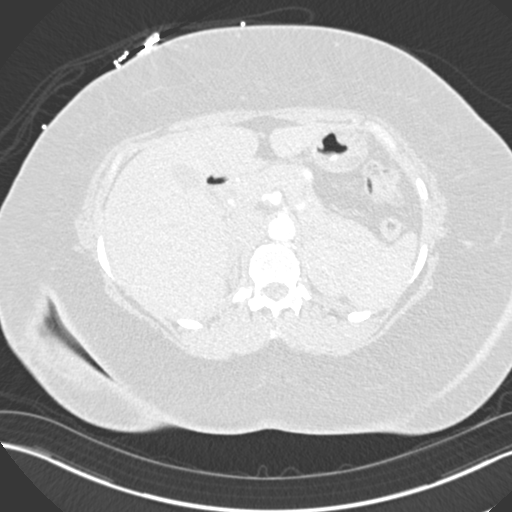
[im 47/420  mediastinal]
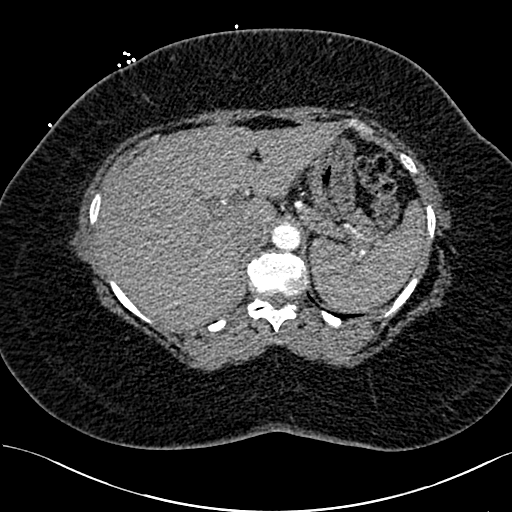
[im 70/420  lung]
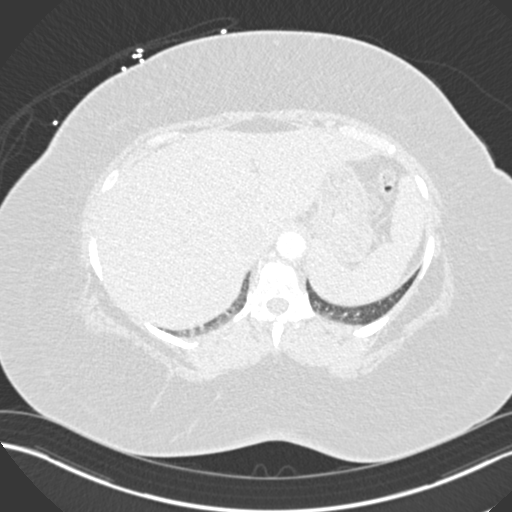
[im 94/420  mediastinal]
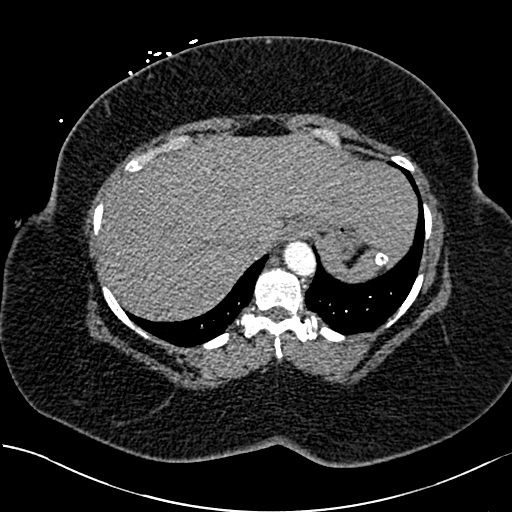
[im 117/420  lung]
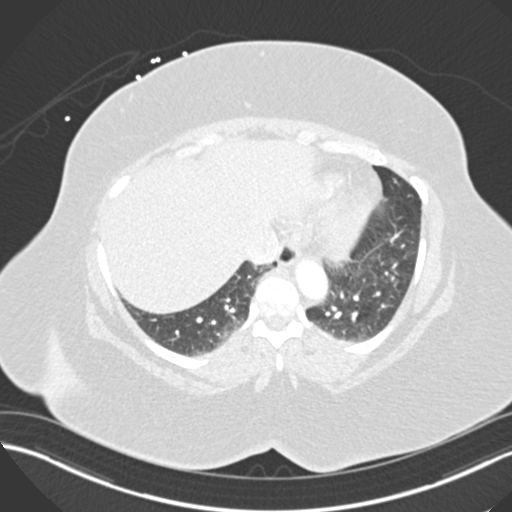
[im 140/420  mediastinal]
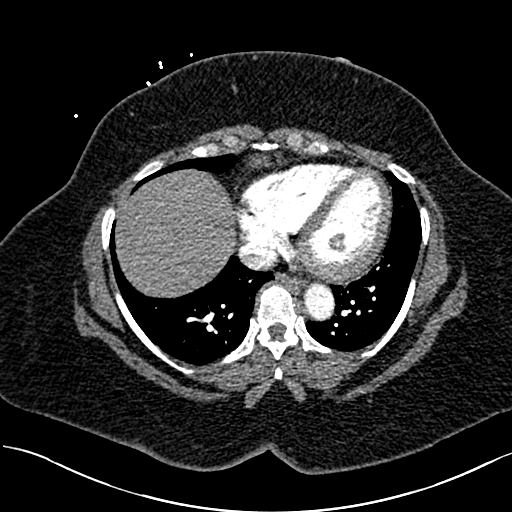
[im 163/420  lung]
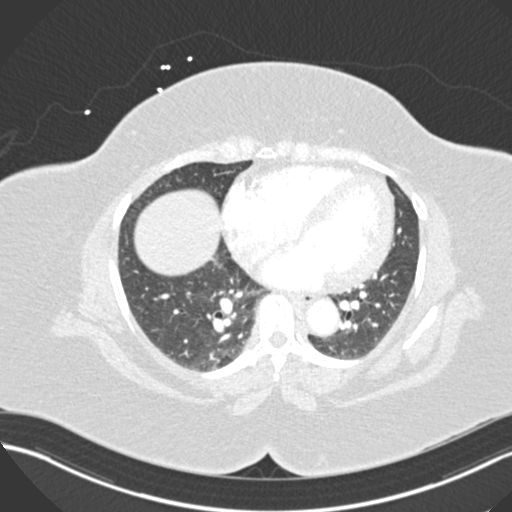
[im 187/420  mediastinal]
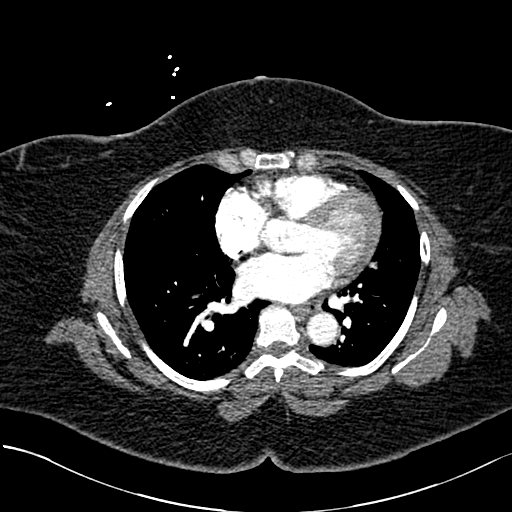
[im 210/420  lung]
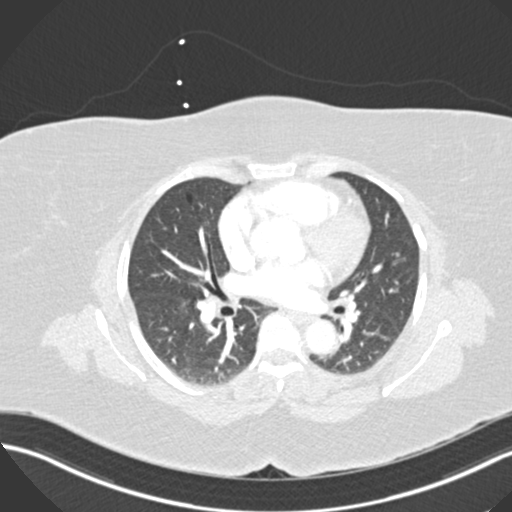
[im 233/420  mediastinal]
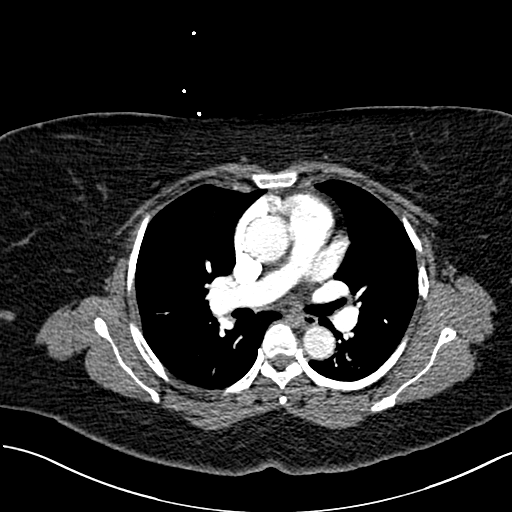
[im 257/420  lung]
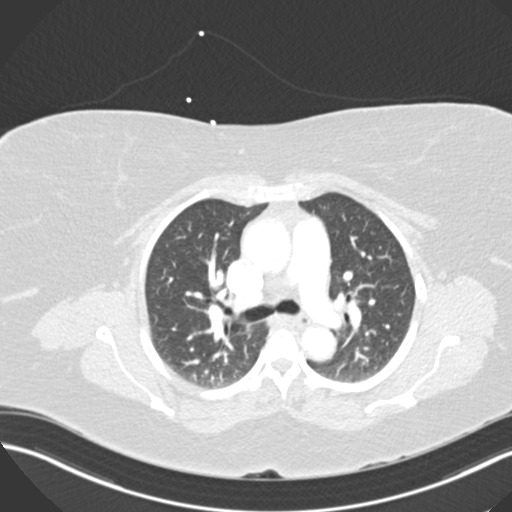
[im 280/420  mediastinal]
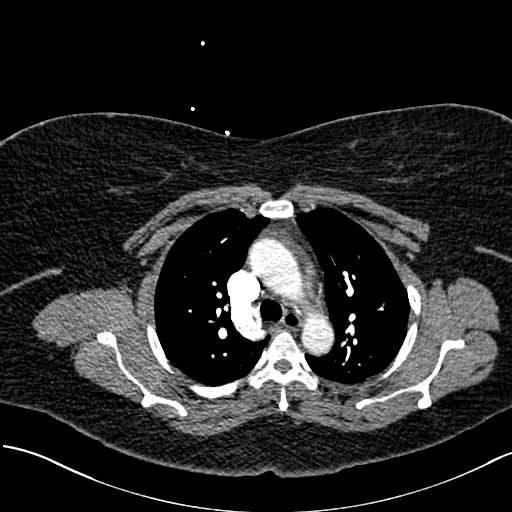
[im 303/420  lung]
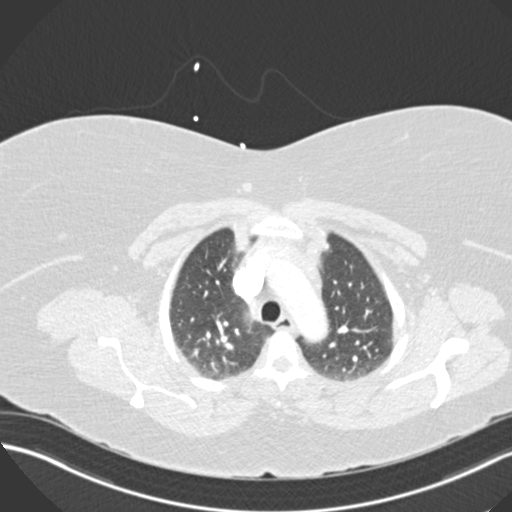
[im 326/420  mediastinal]
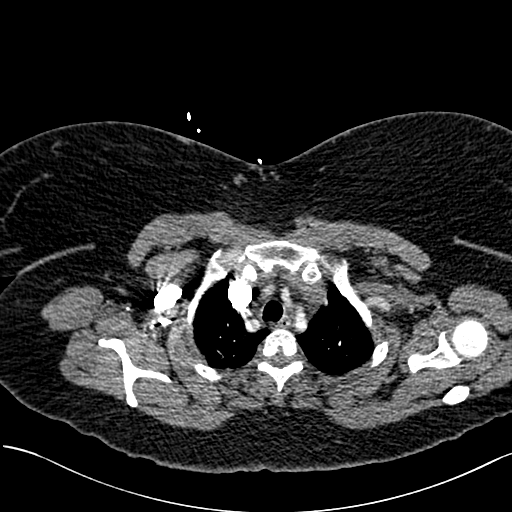
[im 350/420  lung]
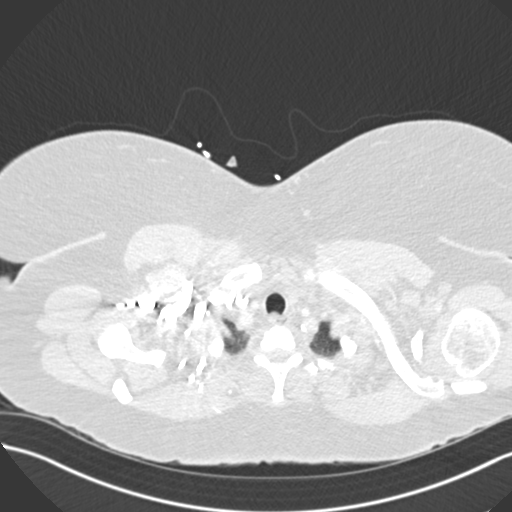
[im 373/420  mediastinal]
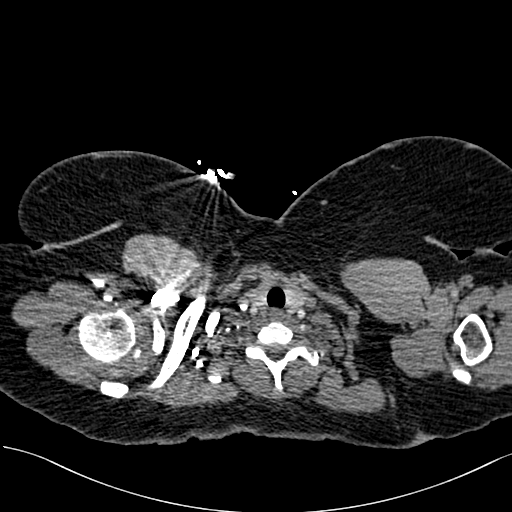
[im 396/420  lung]
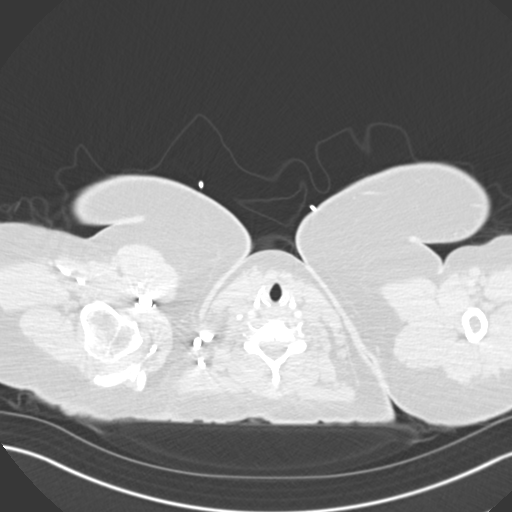

[Series 7: pe coronal mpr · coronal · 0.62mm/px · 1 of 101 slices shown]
[im 51/101  mediastinal]
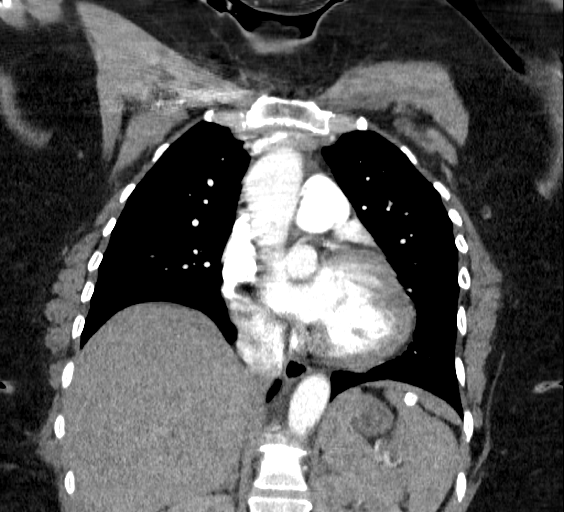

[18 of 36 positions shown; findings below may reference images not displayed]

RADIATION DOSE REDUCTION: This exam was performed according to the
departmental dose-optimization program which includes automated
exposure control, adjustment of the mA and/or kV according to
patient size and/or use of iterative reconstruction technique.

CONTRAST:  100mL OMNIPAQUE IOHEXOL 350 MG/ML SOLN
FINDINGS: Cardiovascular: Satisfactory opacification of the pulmonary arteries
to the segmental level. No evidence of pulmonary embolism. Aortic
atherosclerosis without aneurysmal dilation of the aorta. Normal
heart size. No pericardial effusion.

Mediastinum/Nodes: No suspicious thyroid nodule. No pathologically
enlarged mediastinal, hilar or axillary lymph nodes. Esophagus is
grossly unremarkable.

Lungs/Pleura: No suspicious pulmonary nodules or masses. No pleural
effusion. No pneumothorax. No focal airspace consolidation.
Hypoventilatory change in the dependent lungs.

Upper Abdomen: No acute abnormality.

Musculoskeletal: No aggressive lytic or blastic lesion of bone.

Review of the MIP images confirms the above findings.
IMPRESSION: No evidence of pulmonary embolus or other acute cardiopulmonary
disease.

## 2023-01-27 IMAGING — US US EXTREM LOW VENOUS*L*
1 series · 14 of 24 positions shown · non-contrast
Comparison: None Available.

CLINICAL DATA: Left leg pain.

EXAM:
LEFT LOWER EXTREMITY VENOUS DOPPLER ULTRASOUND
TECHNIQUE: Gray-scale sonography with compression, as well as color and duplex
ultrasound, were performed to evaluate the deep venous system(s)
from the level of the common femoral vein through the popliteal and
proximal calf veins.

[Series 1: us extrem low venous*left* · 14 of 31 slices shown]
[im 1/31]
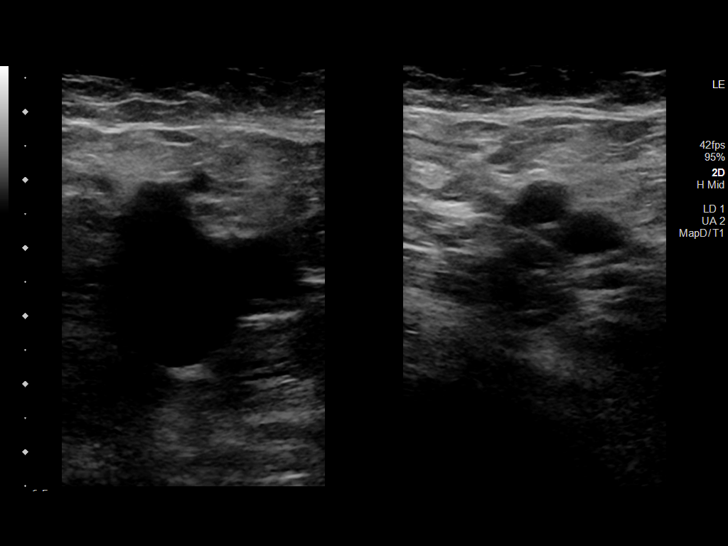
[im 3/31]
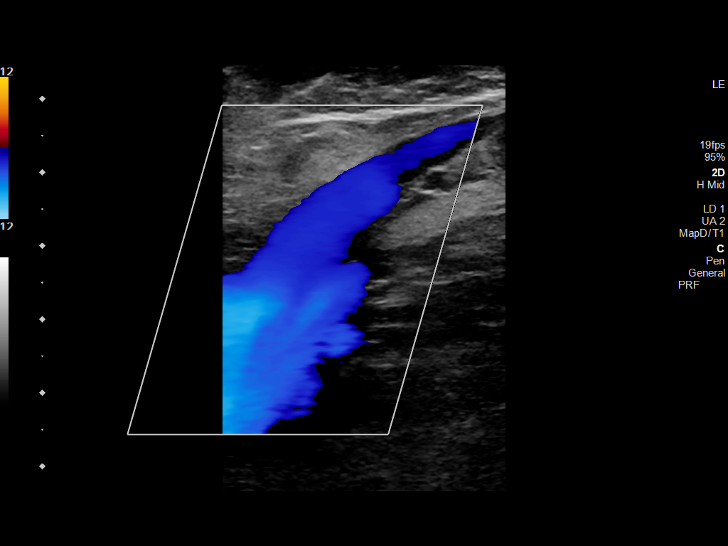
[im 6/31]
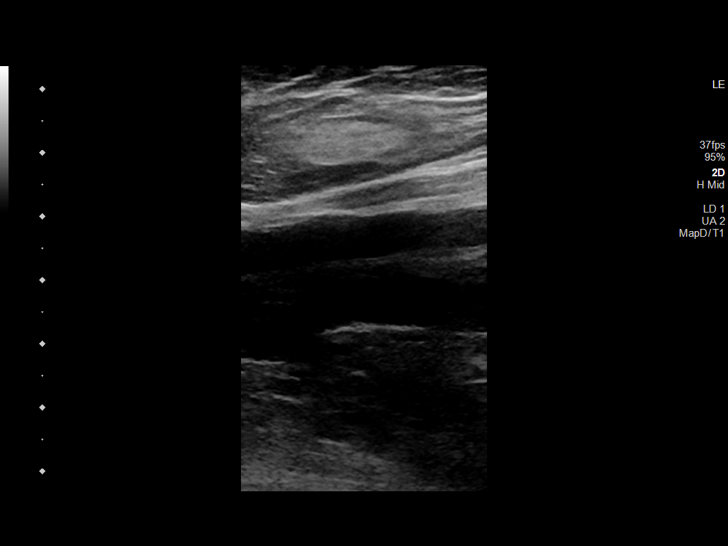
[im 8/31]
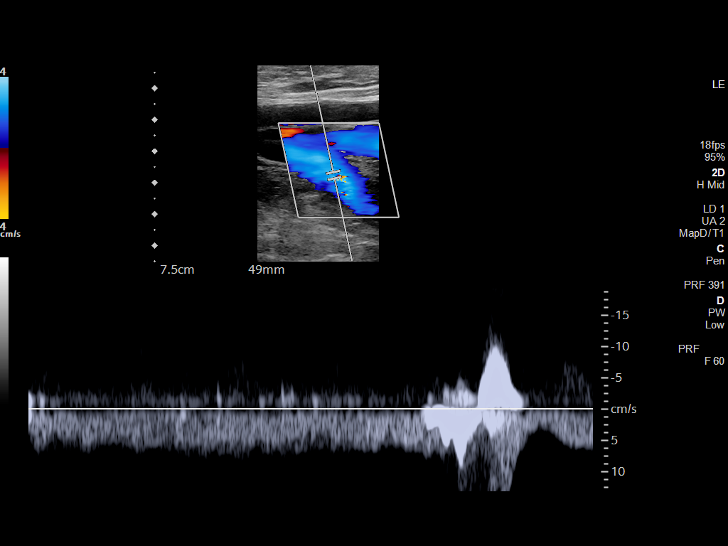
[im 10/31]
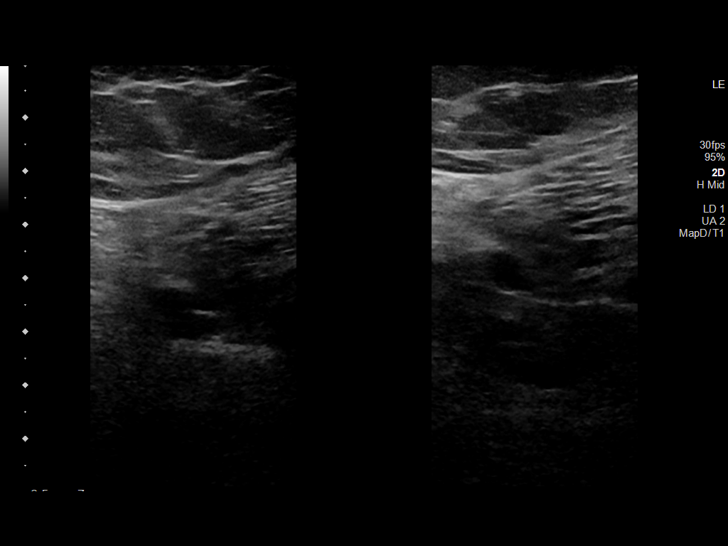
[im 12/31]
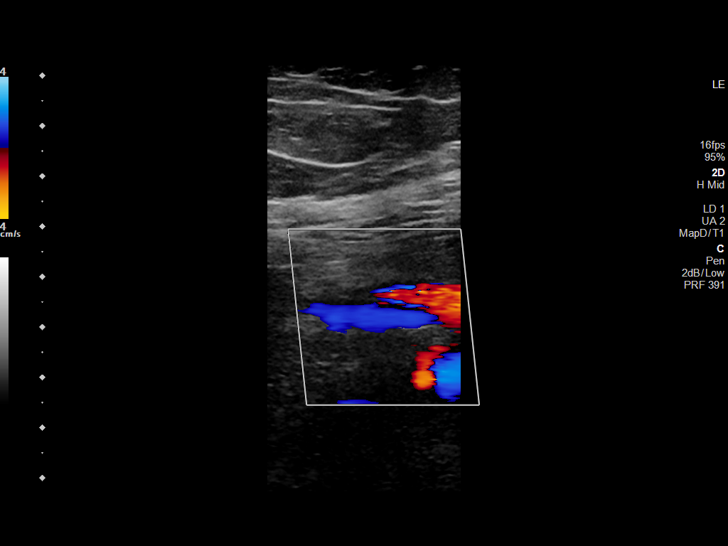
[im 15/31]
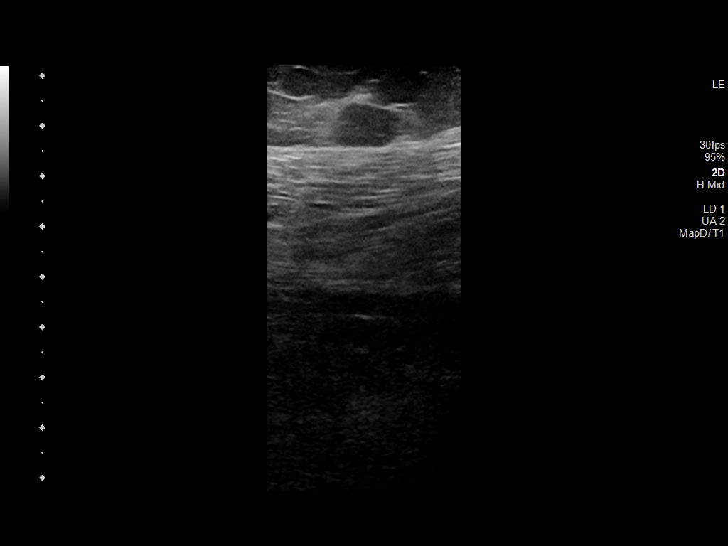
[im 16/31]
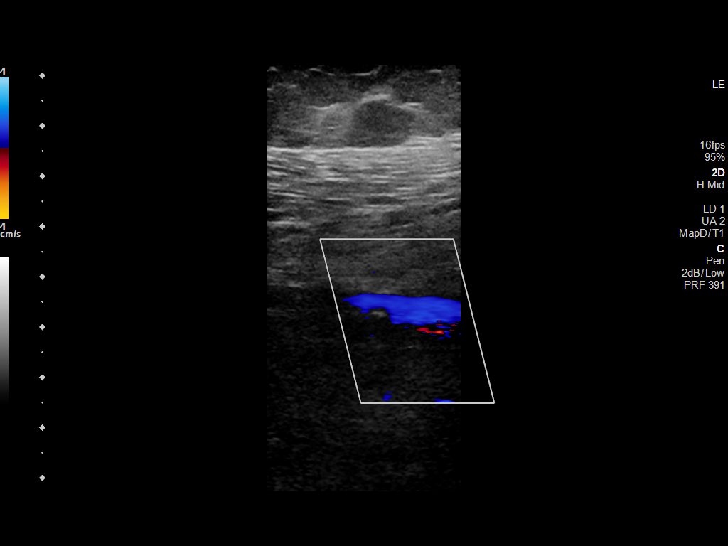
[im 19/31]
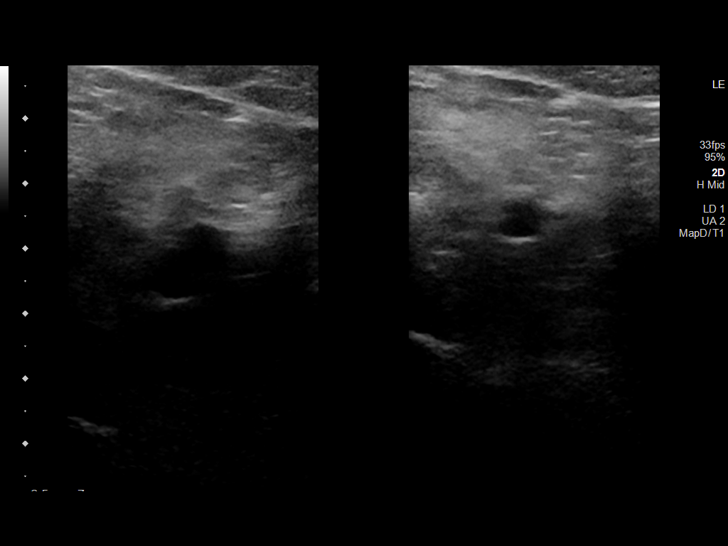
[im 21/31]
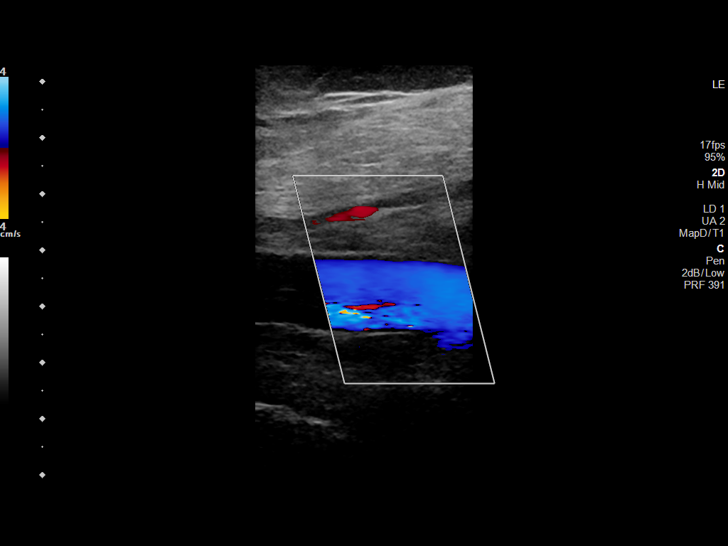
[im 24/31]
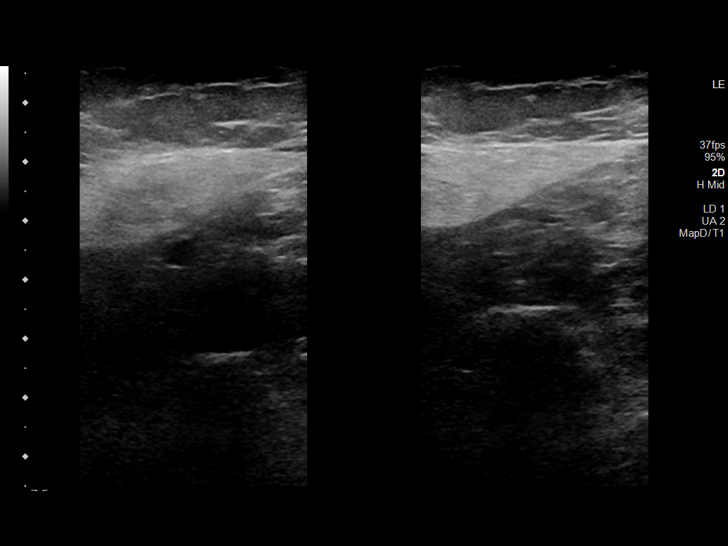
[im 25/31]
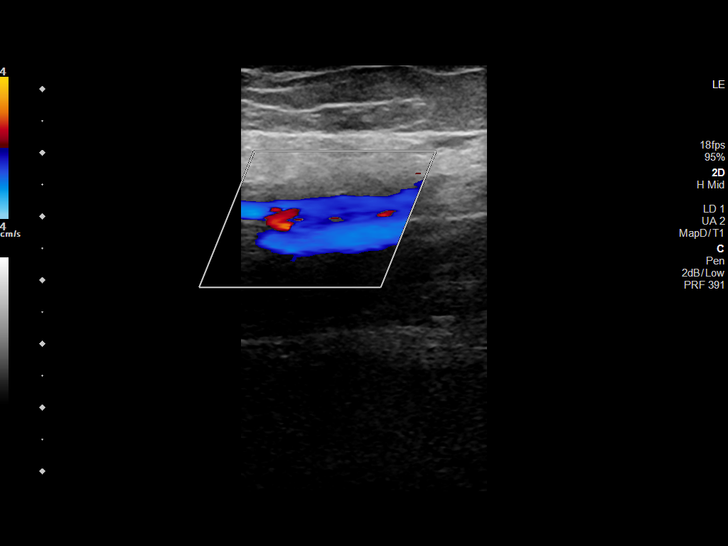
[im 28/31]
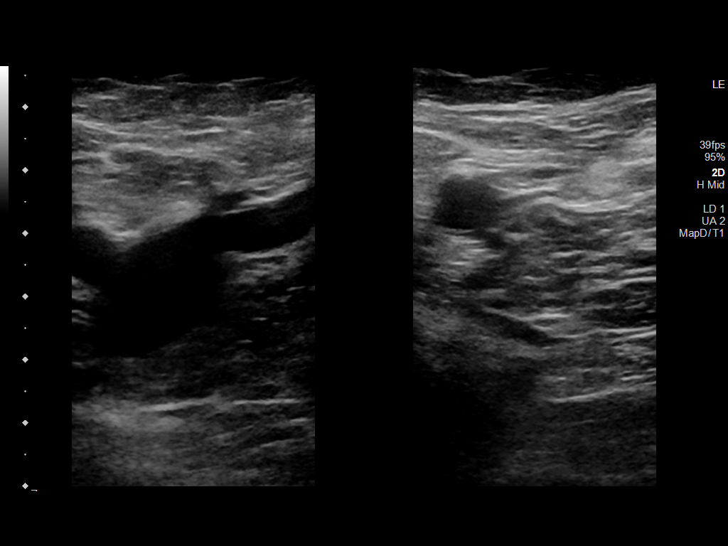
[im 31/31]
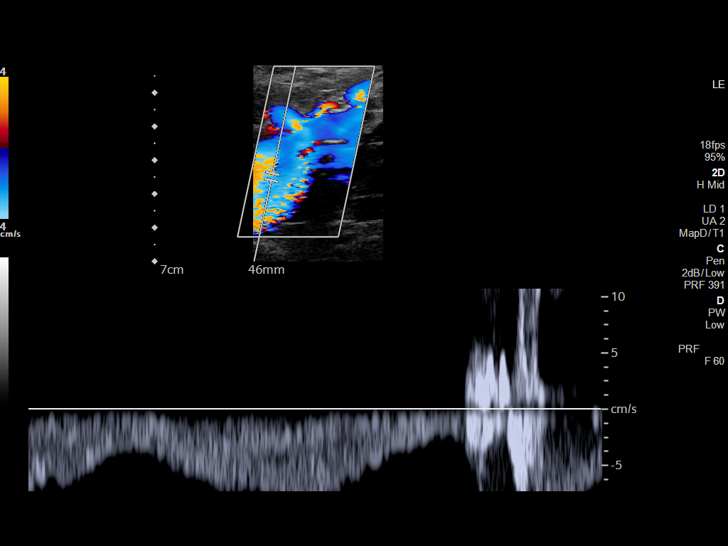

[14 of 24 positions shown; findings below may reference images not displayed]

FINDINGS: VENOUS

Normal compressibility of the common femoral, superficial femoral,
and popliteal veins, as well as the visualized calf veins.
Visualized portions of profunda femoral vein and great saphenous
vein unremarkable. No filling defects to suggest DVT on grayscale or
color Doppler imaging. Doppler waveforms show normal direction of
venous flow, normal respiratory plasticity and response to
augmentation.

Limited views of the contralateral common femoral vein are
unremarkable.

OTHER

None.

Limitations: Suboptimal visualization of the distal femoral vein and
calf veins is noted, as per the ultrasound technologist.
IMPRESSION: Limited study, as described above, without evidence of DVT within
the LEFT lower extremity.
